# Patient Record
Sex: Female | Born: 1955 | Hispanic: No | Marital: Married | Smoking: Never smoker
Health system: Southern US, Community
[De-identification: ages and names within clinical notes are randomized; demographics above are authoritative.]

---

## 2001-01-26 ENCOUNTER — Encounter: Payer: Self-pay | Admitting: Emergency Medicine

## 2001-01-26 ENCOUNTER — Emergency Department (HOSPITAL_COMMUNITY): Admission: EM | Admit: 2001-01-26 | Discharge: 2001-01-26 | Payer: Self-pay | Admitting: Emergency Medicine

## 2005-07-18 ENCOUNTER — Other Ambulatory Visit: Payer: Self-pay

## 2005-07-18 ENCOUNTER — Emergency Department: Payer: Self-pay | Admitting: Emergency Medicine

## 2005-07-19 ENCOUNTER — Ambulatory Visit: Payer: Self-pay | Admitting: Emergency Medicine

## 2013-02-27 ENCOUNTER — Other Ambulatory Visit (HOSPITAL_COMMUNITY)
Admission: RE | Admit: 2013-02-27 | Discharge: 2013-02-27 | Disposition: A | Discharge status: Home / Self Care | Payer: BC Managed Care – PPO | Source: Ambulatory Visit | Attending: Internal Medicine | Admitting: Internal Medicine

## 2013-02-27 DIAGNOSIS — Z01419 Encounter for gynecological examination (general) (routine) without abnormal findings: Secondary | ICD-10-CM | POA: Insufficient documentation

## 2013-03-02 ENCOUNTER — Other Ambulatory Visit: Payer: Self-pay

## 2013-03-02 DIAGNOSIS — Z1231 Encounter for screening mammogram for malignant neoplasm of breast: Secondary | ICD-10-CM

## 2013-03-17 ENCOUNTER — Ambulatory Visit
Admission: RE | Admit: 2013-03-17 | Discharge: 2013-03-17 | Disposition: A | Discharge status: Home / Self Care | Payer: BC Managed Care – PPO | Source: Ambulatory Visit

## 2013-03-17 DIAGNOSIS — Z1231 Encounter for screening mammogram for malignant neoplasm of breast: Secondary | ICD-10-CM

## 2013-06-04 HISTORY — PX: EYE SURGERY: SHX253

## 2013-07-09 ENCOUNTER — Other Ambulatory Visit: Payer: Self-pay | Admitting: Internal Medicine

## 2013-07-09 DIAGNOSIS — E010 Iodine-deficiency related diffuse (endemic) goiter: Secondary | ICD-10-CM

## 2013-07-14 ENCOUNTER — Ambulatory Visit
Admission: RE | Admit: 2013-07-14 | Discharge: 2013-07-14 | Disposition: A | Discharge status: Home / Self Care | Payer: BC Managed Care – PPO | Source: Ambulatory Visit | Attending: Internal Medicine | Admitting: Internal Medicine

## 2013-07-14 DIAGNOSIS — E010 Iodine-deficiency related diffuse (endemic) goiter: Secondary | ICD-10-CM

## 2013-07-20 ENCOUNTER — Other Ambulatory Visit: Payer: Self-pay | Admitting: Internal Medicine

## 2013-07-20 DIAGNOSIS — Z1211 Encounter for screening for malignant neoplasm of colon: Secondary | ICD-10-CM

## 2014-06-13 IMAGING — MG MM DIGITAL SCREENING BILAT
4 series · 4 of 4 positions shown · non-contrast
Comparison: None.

CLINICAL DATA: Screening.

EXAM:
DIGITAL SCREENING BILATERAL MAMMOGRAM WITH CAD

[R MLO]
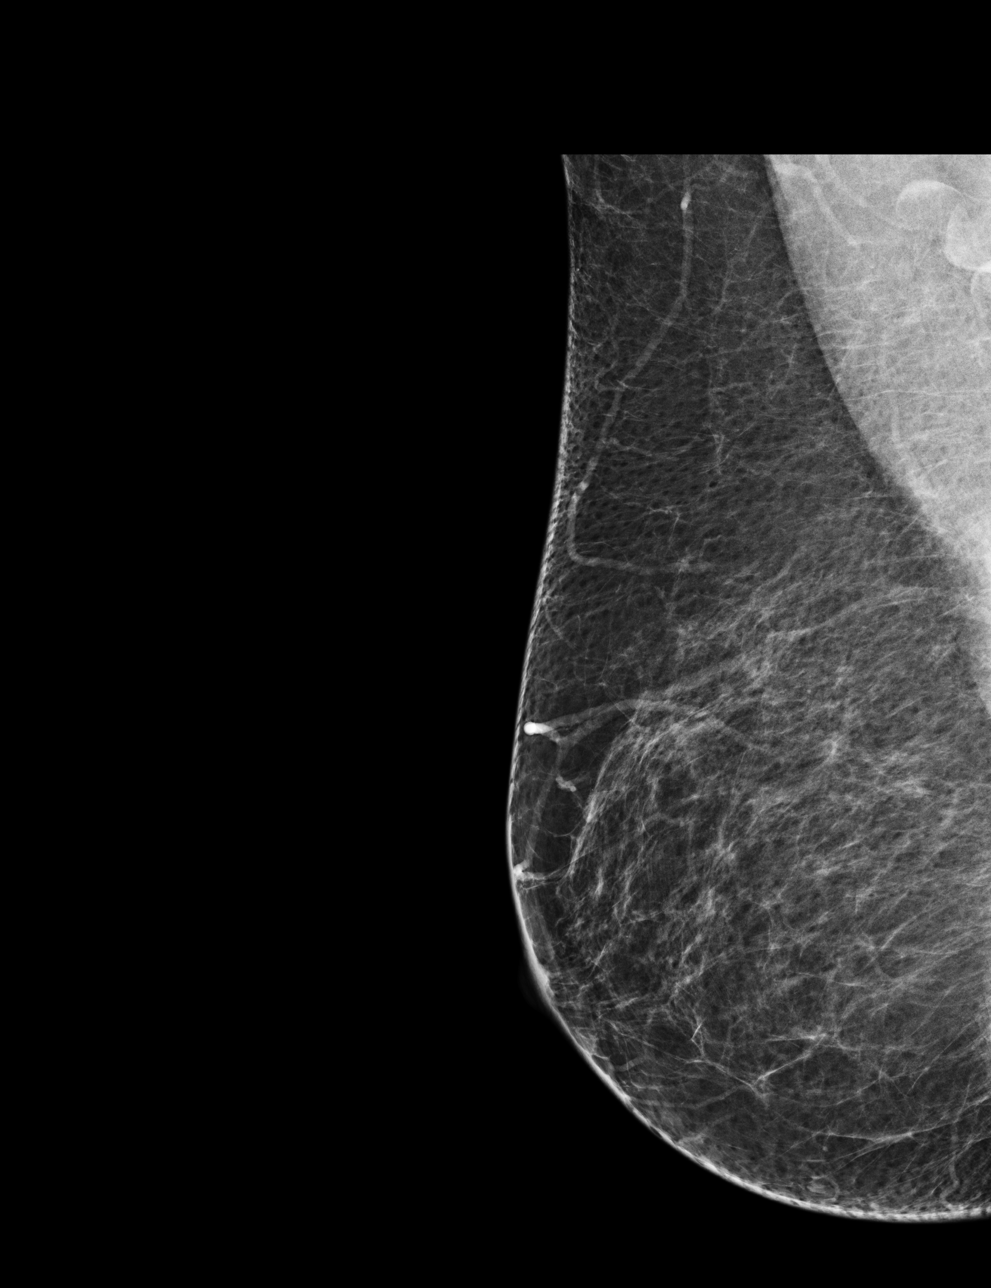

[R CC]
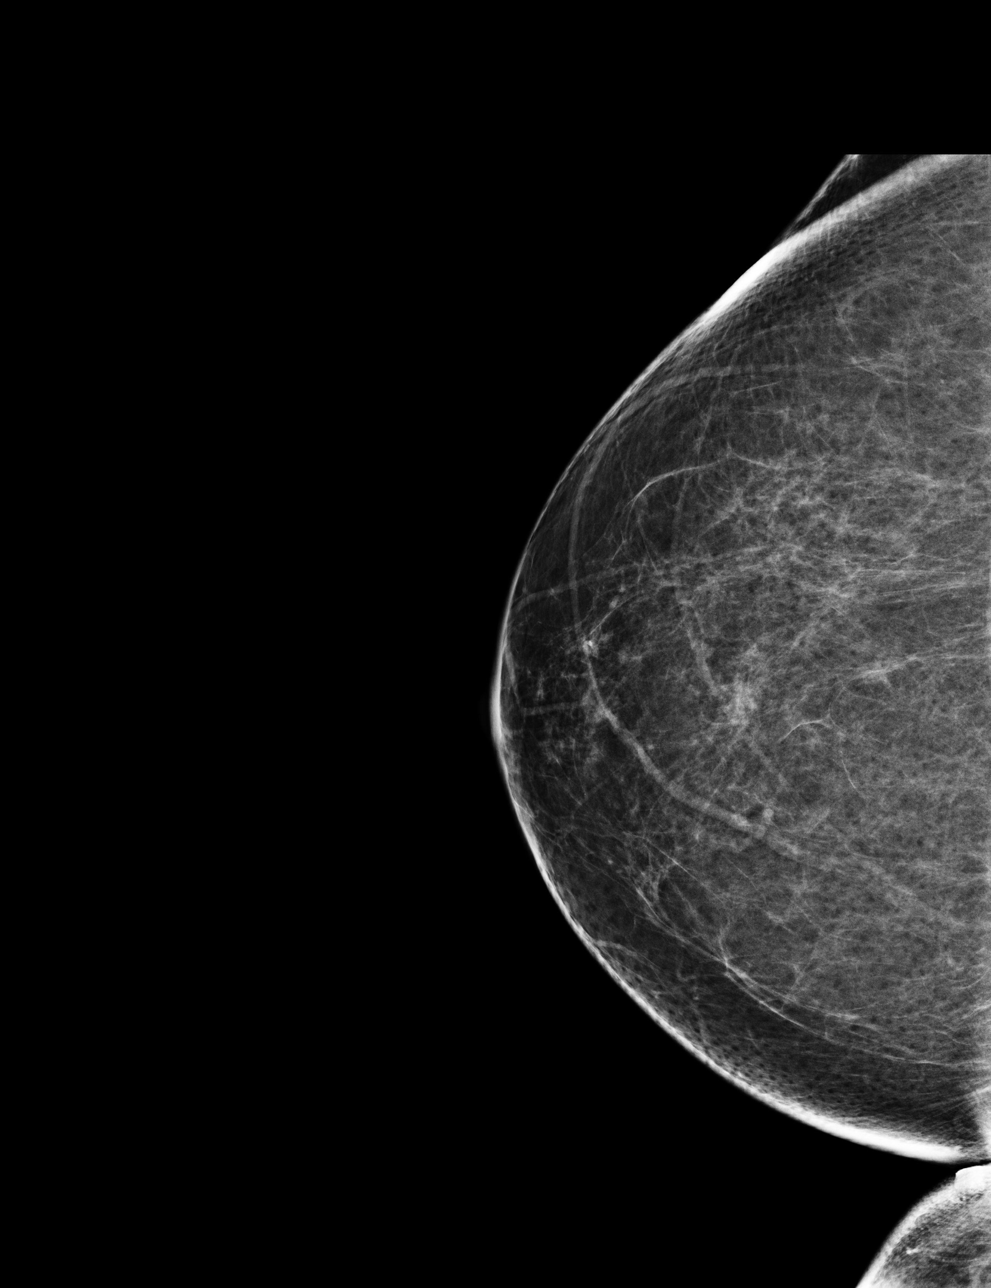

[L MLO]
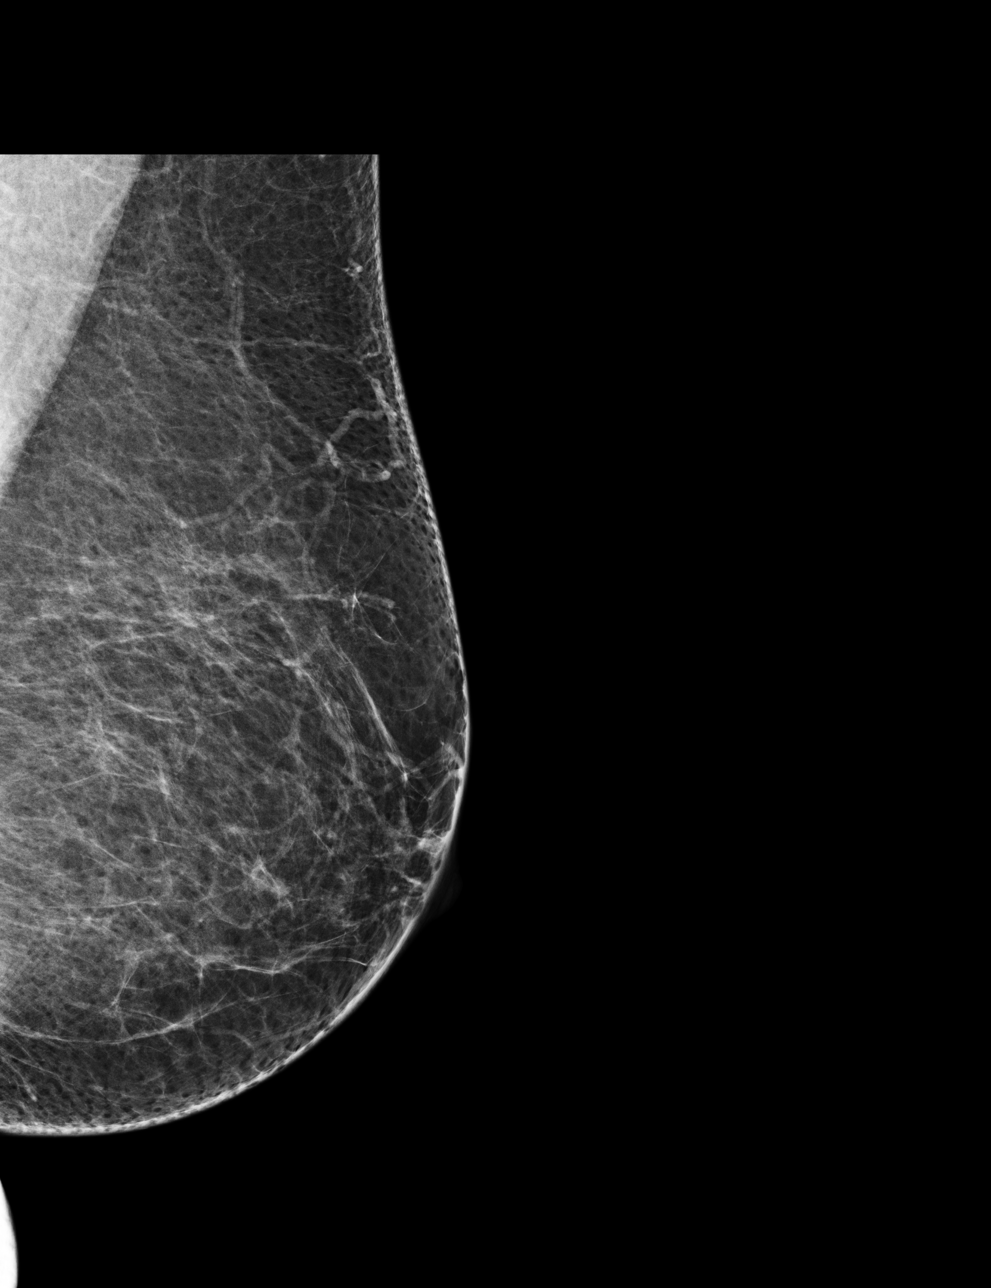

[L CC]
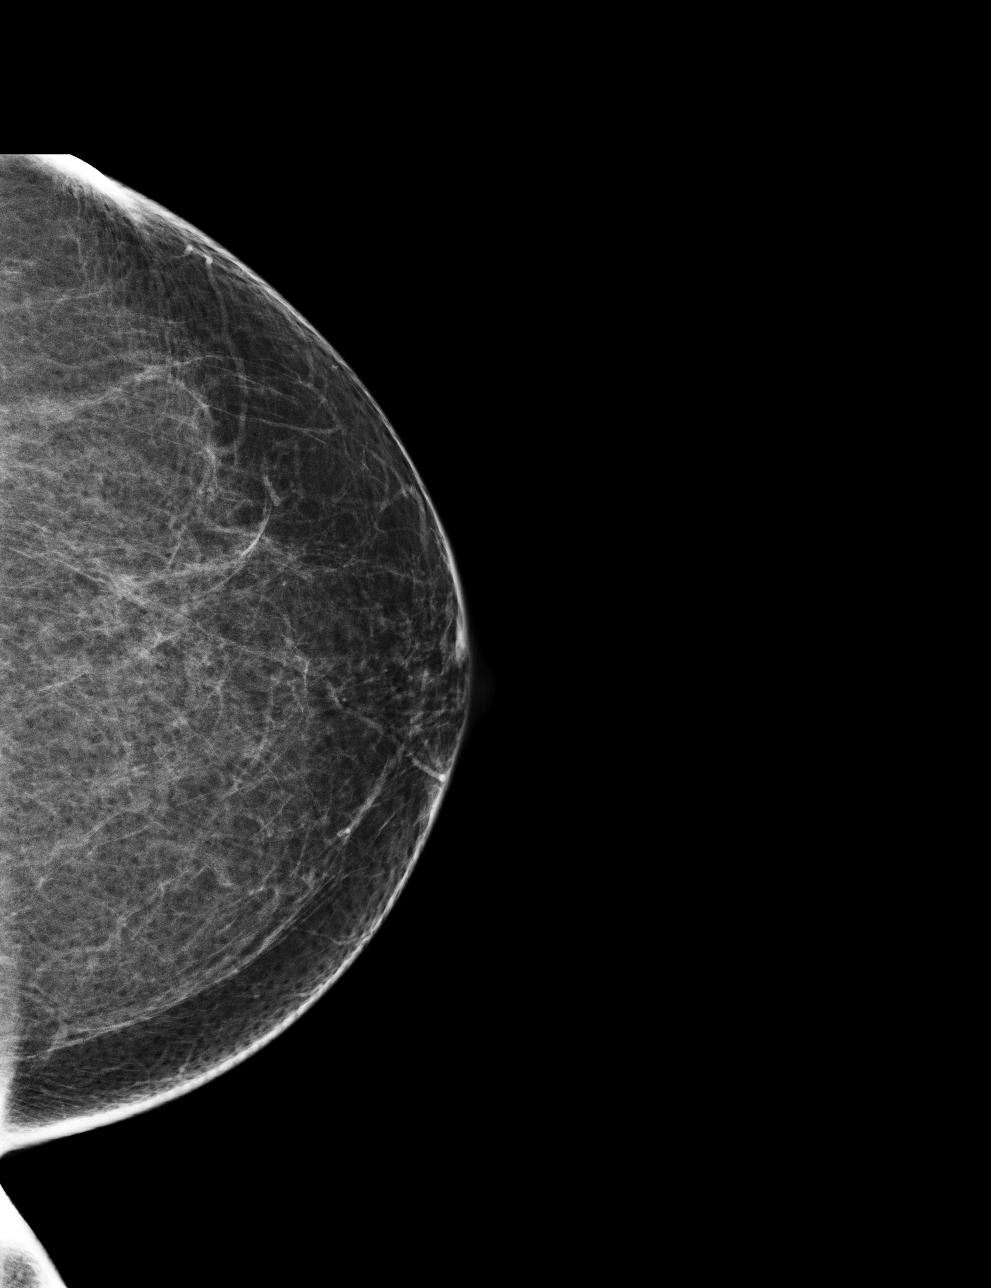

[4 of 4 positions shown; findings below may reference images not displayed]

ACR Breast Density Category b: There are scattered areas of
fibroglandular density.
FINDINGS: There are no findings suspicious for malignancy. Images were
processed with CAD.
IMPRESSION: No mammographic evidence of malignancy. A result letter of this
screening mammogram will be mailed directly to the patient.

RECOMMENDATION:
Screening mammogram in one year. (Code:WX-X-66M)

BI-RADS CATEGORY  1: Negative

## 2014-10-10 IMAGING — US US SOFT TISSUE HEAD/NECK
1 series · 14 of 25 positions shown · non-contrast
Comparison: None.

CLINICAL DATA: Thyromegaly on physical examination.

EXAM:
THYROID ULTRASOUND
TECHNIQUE: Ultrasound examination of the thyroid gland and adjacent soft
tissues was performed.

[Series 1: us soft tissue head/neck · 0.08mm/px · 14 of 38 slices shown]
[im 1/38]
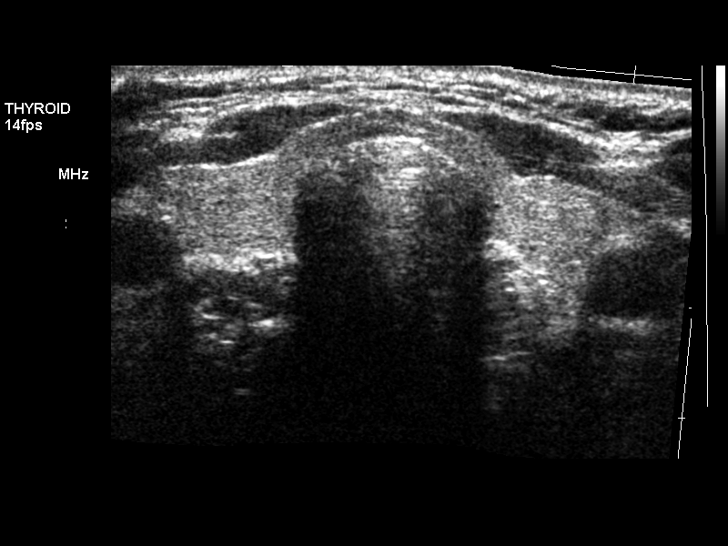
[im 4/38]
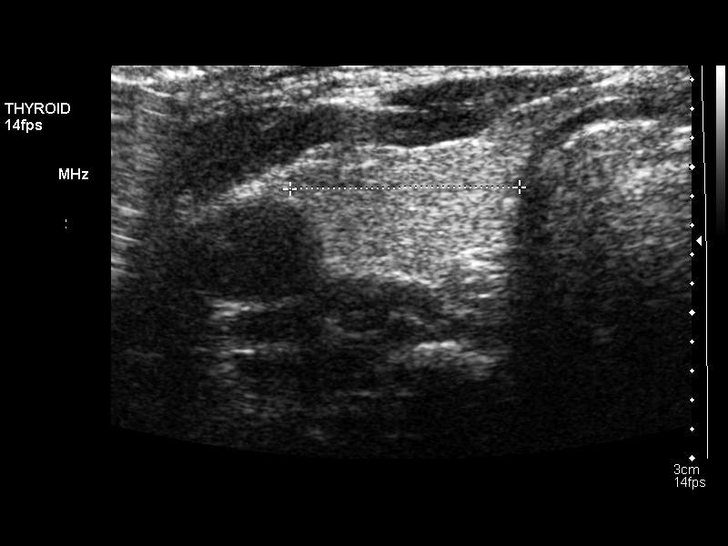
[im 7/38]
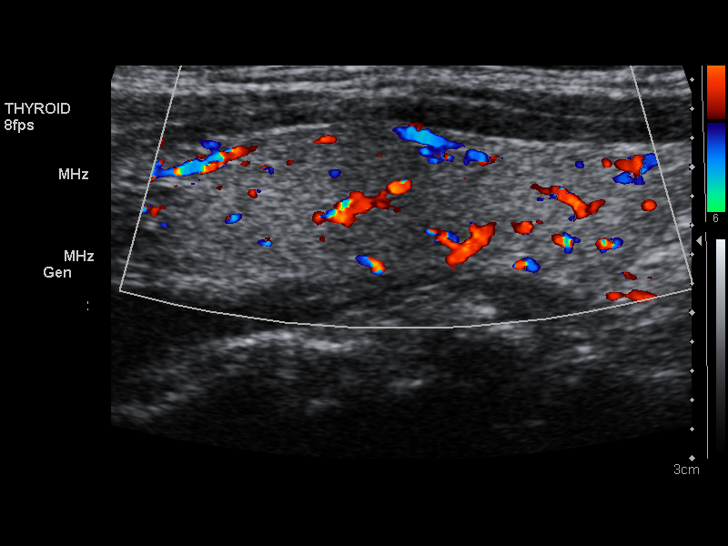
[im 10/38]
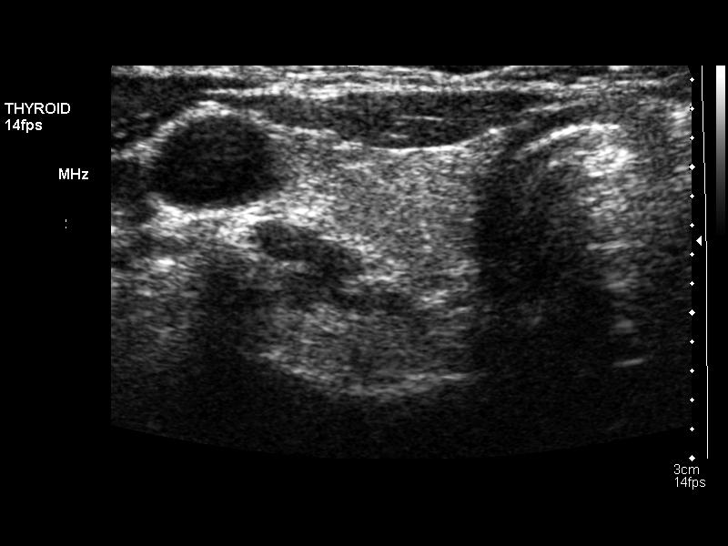
[im 13/38]
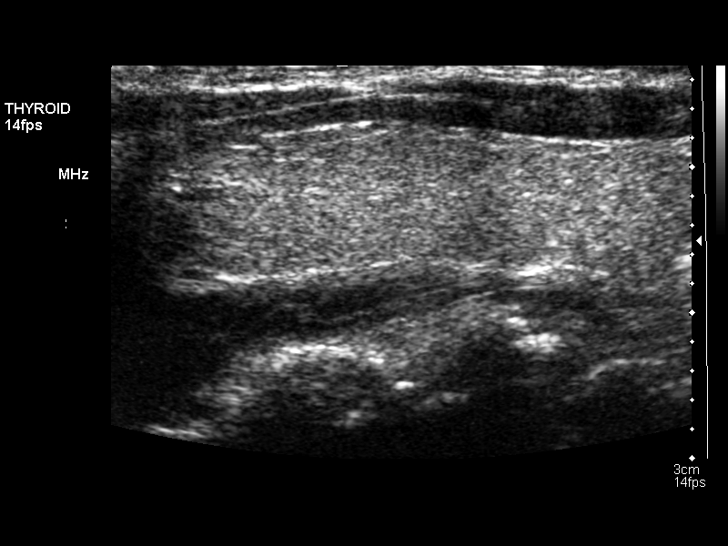
[im 14/38]
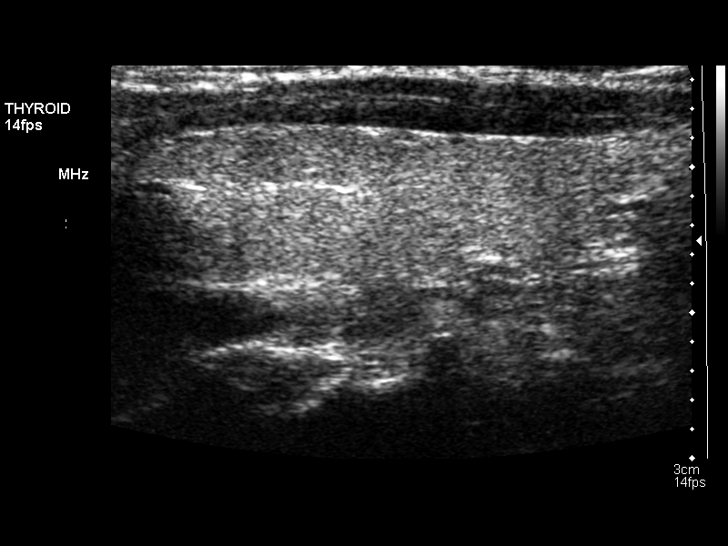
[im 17/38]
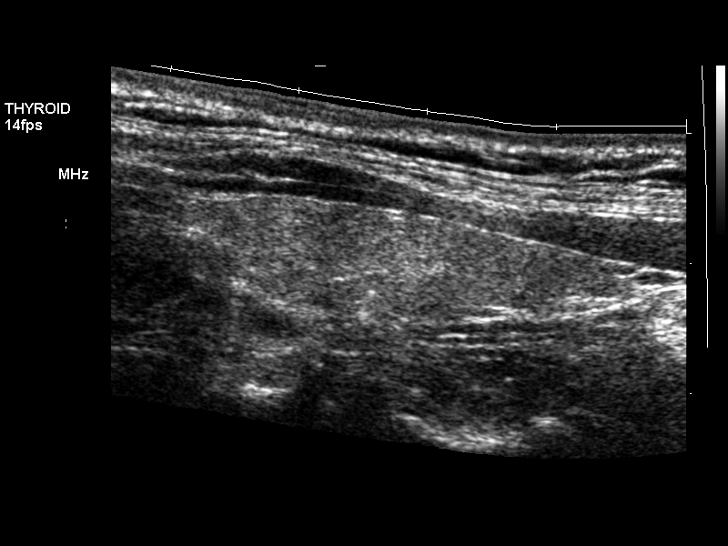
[im 21/38]
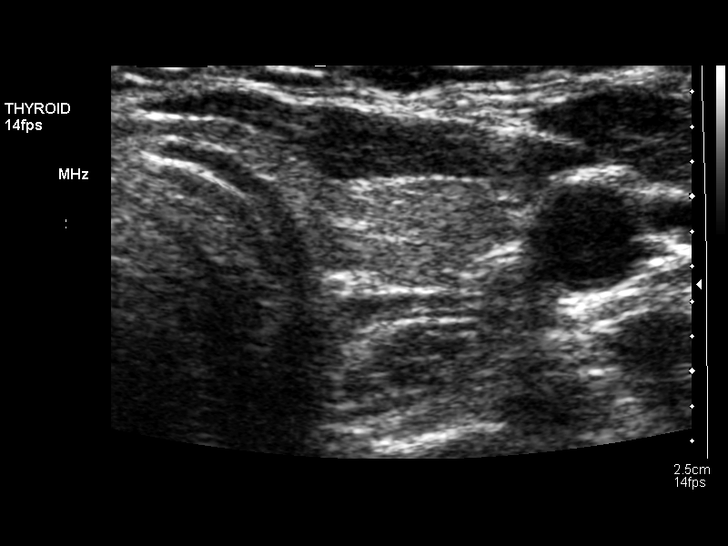
[im 24/38]
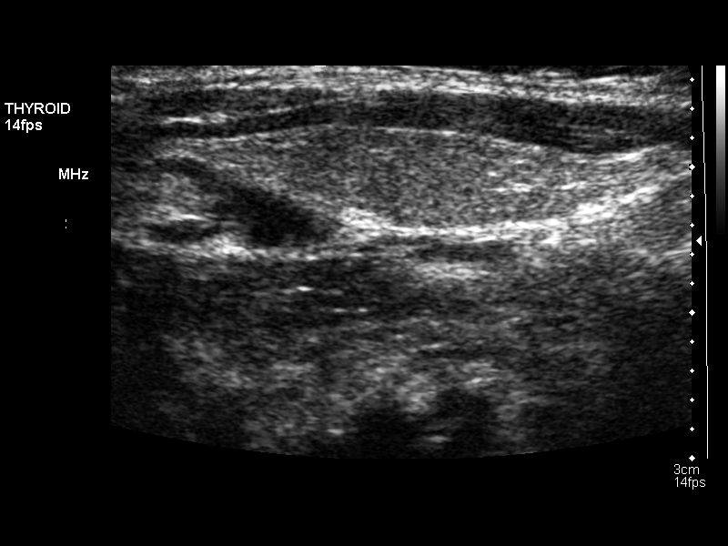
[im 25/38]
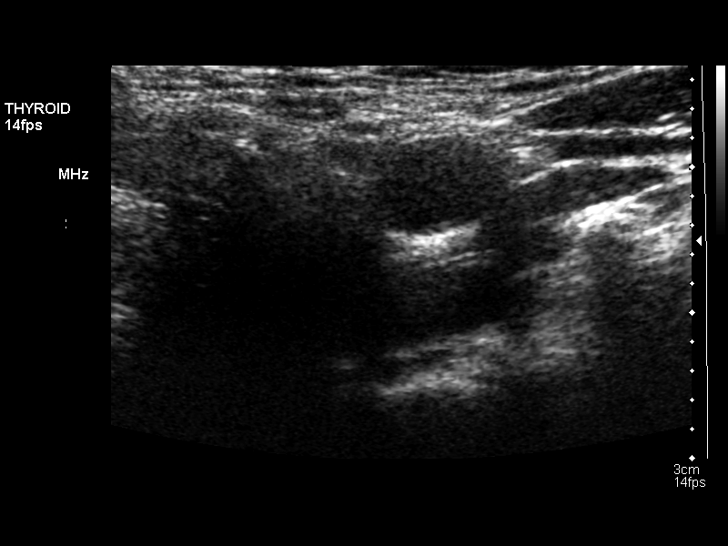
[im 28/38]
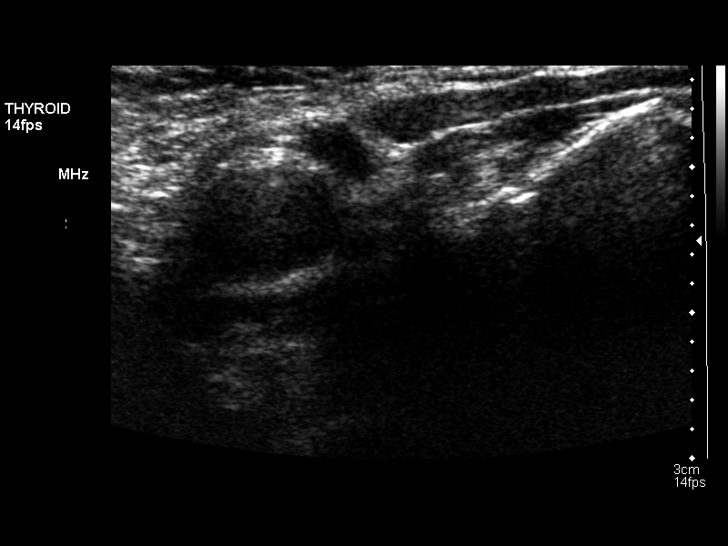
[im 31/38]
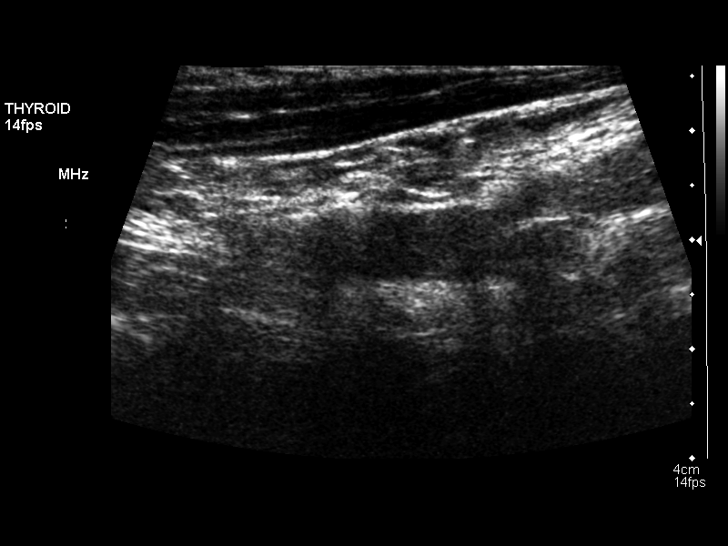
[im 34/38]
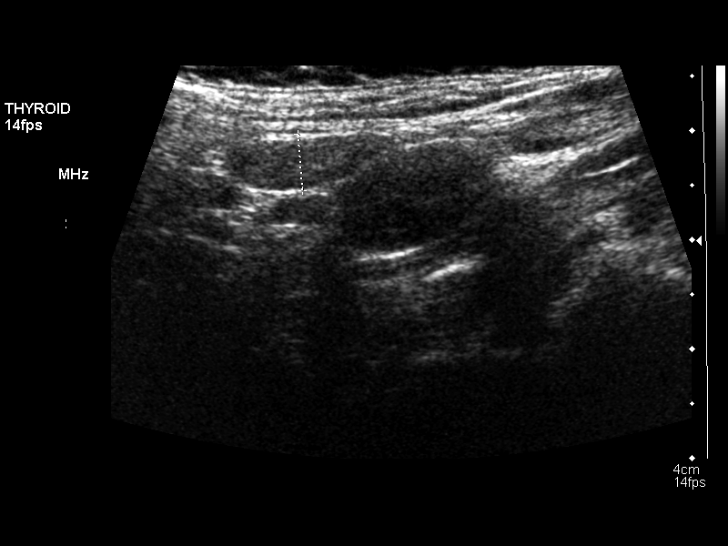
[im 38/38]
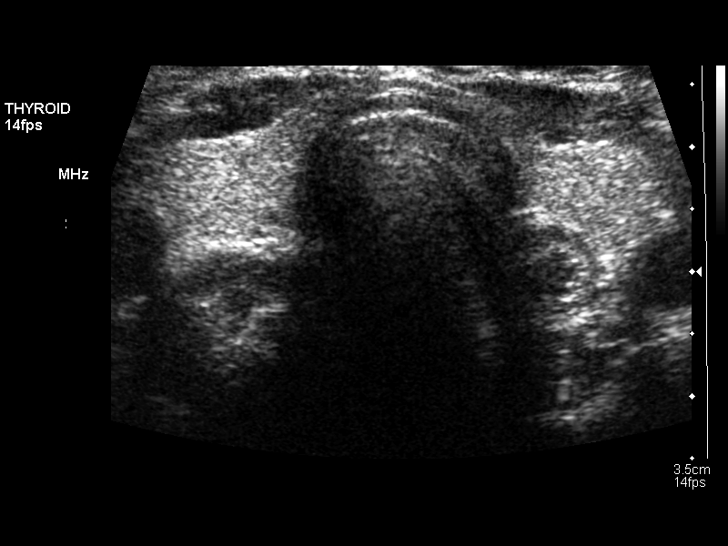

[14 of 25 positions shown; findings below may reference images not displayed]

FINDINGS: Right thyroid lobe

Measurements: 4.1 x 1.2 x 1.6 cm.  No nodules visualized.

Left thyroid lobe

Measurements: 3.7 x 1.0 x 1.6 cm.  No nodules visualized.

Isthmus

Thickness: 0.2 cm.  No nodules visualized.

The thyroid gland is normal in size and volume. Echotexture is
homogeneous. Vascularity appears subjectively normal.

Lymphadenopathy

None visualized.
IMPRESSION: Normal thyroid ultrasound.

## 2016-03-03 ENCOUNTER — Ambulatory Visit (HOSPITAL_COMMUNITY)
Admission: EM | Admit: 2016-03-03 | Discharge: 2016-03-03 | Disposition: A | Discharge status: Home / Self Care | Payer: BLUE CROSS/BLUE SHIELD | Attending: Family Medicine | Admitting: Family Medicine

## 2016-03-03 ENCOUNTER — Encounter (HOSPITAL_BASED_OUTPATIENT_CLINIC_OR_DEPARTMENT_OTHER): Payer: Self-pay | Admitting: Emergency Medicine

## 2016-03-03 ENCOUNTER — Encounter (HOSPITAL_COMMUNITY): Payer: Self-pay | Admitting: *Deleted

## 2016-03-03 ENCOUNTER — Emergency Department (HOSPITAL_BASED_OUTPATIENT_CLINIC_OR_DEPARTMENT_OTHER): Payer: BLUE CROSS/BLUE SHIELD

## 2016-03-03 ENCOUNTER — Emergency Department (HOSPITAL_BASED_OUTPATIENT_CLINIC_OR_DEPARTMENT_OTHER)
Admission: EM | Admit: 2016-03-03 | Discharge: 2016-03-04 | Disposition: A | Discharge status: Other Acute Inpatient Hospital | Payer: BLUE CROSS/BLUE SHIELD | Attending: Emergency Medicine | Admitting: Emergency Medicine

## 2016-03-03 DIAGNOSIS — R0902 Hypoxemia: Secondary | ICD-10-CM | POA: Diagnosis not present

## 2016-03-03 DIAGNOSIS — R21 Rash and other nonspecific skin eruption: Secondary | ICD-10-CM | POA: Insufficient documentation

## 2016-03-03 DIAGNOSIS — D72819 Decreased white blood cell count, unspecified: Secondary | ICD-10-CM | POA: Diagnosis not present

## 2016-03-03 DIAGNOSIS — R509 Fever, unspecified: Secondary | ICD-10-CM | POA: Diagnosis not present

## 2016-03-03 LAB — COMPREHENSIVE METABOLIC PANEL
ALBUMIN: 4.2 g/dL (ref 3.5–5.0)
ALK PHOS: 50 U/L (ref 38–126)
ALT: 21 U/L (ref 14–54)
ANION GAP: 10 (ref 5–15)
AST: 31 U/L (ref 15–41)
BUN: 12 mg/dL (ref 6–20)
CALCIUM: 8.5 mg/dL — AB (ref 8.9–10.3)
CO2: 24 mmol/L (ref 22–32)
Chloride: 98 mmol/L — ABNORMAL LOW (ref 101–111)
Creatinine, Ser: 0.76 mg/dL (ref 0.44–1.00)
GFR calc Af Amer: >60 mL/min (ref >60)
GFR calc non Af Amer: >60 mL/min (ref >60)
GLUCOSE: 112 mg/dL — AB (ref 65–99)
Potassium: 3.2 mmol/L — ABNORMAL LOW (ref 3.5–5.1)
Sodium: 132 mmol/L — ABNORMAL LOW (ref 135–145)
TOTAL PROTEIN: 7.1 g/dL (ref 6.5–8.1)
Total Bilirubin: 0.4 mg/dL (ref 0.3–1.2)

## 2016-03-03 LAB — I-STAT CG4 LACTIC ACID, ED: LACTIC ACID, VENOUS: 1.07 mmol/L (ref 0.5–1.9)

## 2016-03-03 LAB — URINALYSIS, ROUTINE W REFLEX MICROSCOPIC
Bilirubin Urine: NEGATIVE
GLUCOSE, UA: NEGATIVE mg/dL
KETONES UR: 15 mg/dL — AB
Leukocytes, UA: NEGATIVE
Nitrite: NEGATIVE
PH: 5.5 (ref 5.0–8.0)
PROTEIN: 30 mg/dL — AB
Specific Gravity, Urine: 1.011 (ref 1.005–1.030)

## 2016-03-03 LAB — CBC WITH DIFFERENTIAL/PLATELET
BASOS PCT: 0 %
Basophils Absolute: 0 10*3/uL (ref 0.0–0.1)
EOS ABS: 0 10*3/uL (ref 0.0–0.7)
EOS PCT: 0 %
HCT: 35.6 % — ABNORMAL LOW (ref 36.0–46.0)
Hemoglobin: 12.5 g/dL (ref 12.0–15.0)
Lymphocytes Relative: 13 %
Lymphs Abs: 0.5 10*3/uL — ABNORMAL LOW (ref 0.7–4.0)
MCH: 30 pg (ref 26.0–34.0)
MCHC: 35.1 g/dL (ref 30.0–36.0)
MCV: 85.6 fL (ref 78.0–100.0)
MONO ABS: 0.4 10*3/uL (ref 0.1–1.0)
MONOS PCT: 10 %
Neutro Abs: 3 10*3/uL (ref 1.7–7.7)
Neutrophils Relative %: 77 %
Platelets: 141 10*3/uL — ABNORMAL LOW (ref 150–400)
RBC: 4.16 MIL/uL (ref 3.87–5.11)
RDW: 12.4 % (ref 11.5–15.5)
WBC: 3.9 10*3/uL — ABNORMAL LOW (ref 4.0–10.5)

## 2016-03-03 LAB — URINE MICROSCOPIC-ADD ON

## 2016-03-03 LAB — LIPASE, BLOOD: Lipase: 38 U/L (ref 11–51)

## 2016-03-03 MED ORDER — ACETAMINOPHEN 500 MG PO TABS
1000.0000 mg | ORAL_TABLET | Freq: Once | ORAL | Status: AC
  Administered 2016-03-03: 1000 mg via ORAL

## 2016-03-03 MED ORDER — DIPHENHYDRAMINE HCL 50 MG/ML IJ SOLN
25.0000 mg | Freq: Once | INTRAMUSCULAR | Status: AC
  Administered 2016-03-03: 25 mg via INTRAVENOUS
  Filled 2016-03-03: qty 1

## 2016-03-03 MED ORDER — ACETAMINOPHEN 500 MG PO TABS
1000.0000 mg | ORAL_TABLET | Freq: Once | ORAL | Status: DC
  Filled 2016-03-03: qty 2

## 2016-03-03 MED ORDER — SODIUM CHLORIDE 0.9 % IV BOLUS (SEPSIS)
1000.0000 mL | Freq: Once | INTRAVENOUS | Status: AC
Start: 2016-03-03 — End: 2016-03-04
  Administered 2016-03-03: 1000 mL via INTRAVENOUS

## 2016-03-03 NOTE — ED Triage Notes (Signed)
Pt  Reports   Symptoms  Of  Fever    Body  Aches   And  Rash   Her face  Is  Swollen       She    Reports  Recent    Pt  Reports  Symptoms   After arriving via   Airplane    From  [place]    Pt  Is   Sitting  Upright  On the  Exam table     She  Took  Some    otc    Cold  Medicine  As  Well  As  Tylenol  Yesterday

## 2016-03-03 NOTE — ED Notes (Signed)
Patient transported to X-ray 

## 2016-03-03 NOTE — ED Provider Notes (Signed)
[facility]    CSN: [id number] Arrival date & time: 03/03/16  1916     History   Chief Complaint Chief Complaint  Patient presents with  . Rash    HPI Chelsea Evans is a 60 y.o. female.   The history is provided by the patient and a relative.  Rash  Location:  Full body Quality: redness and swelling   Severity:  Moderate Onset quality:  Sudden Duration:  2 days Progression:  Worsening Chronicity:  Recurrent Context comment:  Onset th[place] from [place] assoc fever , chills. Associated symptoms: fever and nausea     History reviewed. No pertinent past medical history.  There are no active problems to display for this patient.   History reviewed. No pertinent surgical history.  OB History    No data available       Home Medications    Prior to Admission medications   Medication Sig Start Date End Date Taking? Authorizing Provider  Homeopathic Products [facility](ZICAM COLD REMEDY PO) Take by mouth.   Yes Historical Provider, MD  Ibuprofen (ADVIL PO) Take by mouth.   Yes Historical Provider, MD    Family History No family history on file.  Social History Social History  Substance Use Topics  . Smoking status: Not on file  . Smokeless tobacco: Not on file  . Alcohol use Not on file     Allergies   Aspirin and Nyquil multi-symptom [pseudoeph-doxylamine-dm-apap]   Review of Systems Review of Systems  Constitutional: Positive for chills and fever.  HENT: Negative.   Respiratory: Negative.   Gastrointestinal: Positive for nausea.  Genitourinary: Negative.   Skin: Positive for rash.  All other systems reviewed and are negative.    Physical Exam Triage Vital Signs ED Triage Vitals [03/03/16 2006]  Enc Vitals Group     BP 144/83     Pulse Rate 110     Resp 18     Temp (!) 103.3 F (39.6 C)     Temp Source Oral     SpO2 97 %     Weight      Height      Head Circumference      Peak Flow      Pain Score    Pain Loc      Pain Edu?      Excl. in GC?    No data found.   Updated Vital Signs BP 144/83 (BP Location: Right Arm)   Pulse 110   Temp (!) 103.3 F (39.6 C) (Oral)   Resp 18   SpO2 97%   Visual Acuity Right Eye Distance:   Left Eye Distance:   Bilateral Distance:    Right Eye Near:   Left Eye Near:    Bilateral Near:     Physical Exam  Constitutional: She is oriented to person, place, and time. She appears well-developed and well-nourished. No distress.  HENT:  Mouth/Throat: Oropharynx is clear and moist.  Neck: Normal range of motion. Neck supple.  Cardiovascular: Normal rate, regular rhythm, normal heart sounds and intact distal pulses.   Pulmonary/Chest: Effort normal and breath sounds normal.  Abdominal: Soft. Bowel sounds are normal. There is no tenderness.  Lymphadenopathy:    She has no cervical adenopathy.  Neurological: She is alert and oriented to person, place, and time.  Skin: Rash noted. There is erythema.  Nursing note and vitals reviewed.    UC Treatments / Results  Labs (all labs ordered are  listed, but only abnormal results are displayed) Labs Reviewed - No data to display  EKG  EKG Interpretation None       Radiology No results found.  Procedures Procedures (including critical care time)  Medications Ordered in UC Medications - No data to display   Initial Impression / Assessment and Plan / UC Course  I have reviewed the triage vital signs and the nursing notes.  Pertinent labs & imaging results that were available during my care of the patient were reviewed by me and considered in my medical decision making (see chart for details).  Clinical Course      Final Clinical Impressions(s) / UC Diagnoses   Final diagnoses:  None    New Prescriptions New Prescriptions   No medications on file     Linna Hoff, MD 03/03/16 2040

## 2016-03-03 NOTE — ED Notes (Signed)
Pt 89% on R/A. Pt placed on N/C at 2L. No dyspnea noted, pt speaking in complete sentences between breaths. Dr. Clarene DukeLittle at bedside.

## 2016-03-03 NOTE — ED Notes (Signed)
Per pt clarified that she thinks she is having an allergic reaction with swelling, reddness, and hives to face, hands, and torso. Symptoms started after taking ibuprofen, and APAP for fever and was noticiable yesterday evening (9/29). Pt denies tongue swelling and ShOB. Lung sounds clear & =.   Pt states that on Thursday (9/28) she started running a fever, with nausea and decreased appetite .

## 2016-03-03 NOTE — ED Provider Notes (Signed)
[facility]   CSN: [id number] Arrival date & time: 03/03/16  2116   By signing my name below, I, [name], attest that this documentation has been prepared under the direction and in the presence of Laurence Spatesachel Morgan Little, MD . Electronically Signed: [name], Scribe. 03/03/2016. 10:01 PM.  History   Chief Complaint Chief Complaint  Patient presents with  . Rash  . Fever  . Headache   The history is provided by the patient and a relative. The history is limited by a language barrier. No language interpreter was used.    HPI Comments:  Chelsea Evans is a 60 y.o. female who presents to the Emergency Department complaining of diffuse rash beginning last night. When asked about associated symptoms, her family states that the symptoms began 2 days ago as general body aches and has worsened to also include headaches, fever and chills. Pt denies any SOB, CP, cough, vomiting or diarrhea. Pt returned 2 days ago from a trip to [place], where she stayed for one month. She took a dose of tylenol when her symptoms began, then started taking 1 Advil and 1 Zycam every 4 hours. The pt's family reports that last night the pt took 2 advil at once, and that was around the time when her rash symptoms began. They also report that this type of rash occurs sometimes when the pt takes any OTC medications. Pt is a nonsmoker and does not drink alcohol. Her husband was traveling with her and does not have any symptoms.  History reviewed. No pertinent past medical history.  There are no active problems to display for this patient.   Past Surgical History:  Procedure Laterality Date  . EYE SURGERY  2015    OB History    No data available       Home Medications    Prior to Admission medications   Medication Sig Start Date End Date Taking? Authorizing Provider  Homeopathic Products [facility](ZICAM COLD REMEDY PO) Take by mouth.    Historical Provider, MD  Ibuprofen (ADVIL PO)  Take by mouth.    Historical Provider, MD    Family History No family history on file.  Social History Social History  Substance Use Topics  . Smoking status: Never Smoker  . Smokeless tobacco: Never Used  . Alcohol use No     Allergies   Aspirin and Nyquil multi-symptom [pseudoeph-doxylamine-dm-apap]   Review of Systems Review of Systems  10 Systems reviewed and are negative for acute change except as noted in the HPI.    Physical Exam Updated Vital Signs BP 112/70   Pulse 92   Temp 101.9 F (38.8 C) (Oral)   Resp 25   SpO2 97%   Physical Exam  Constitutional: She is oriented to person, place, and time. She appears well-developed and well-nourished. No distress.  HENT:  Head: Normocephalic and atraumatic.  Mouth/Throat: Oropharynx is clear and moist.  Moist mucous membranes  Eyes: Conjunctivae are normal. Pupils are equal, round, and reactive to light.  Neck: Neck supple.  Cardiovascular: Regular rhythm and normal heart sounds.  Tachycardia present.   No murmur heard. Pulmonary/Chest: Effort normal and breath sounds normal.  Abdominal: Soft. Bowel sounds are normal. She exhibits no distension. There is no tenderness.  Musculoskeletal: She exhibits no edema.  Neurological: She is alert and oriented to person, place, and time.  Fluent speech  Skin: Skin is warm and dry. Capillary refill takes less than 2 seconds. Rash noted.  Malar  rash across cheeks and nose; uritcarial rash on back  Psychiatric: She has a normal mood and affect. Judgment normal.  Nursing note and vitals reviewed.    ED Treatments / Results  DIAGNOSTIC STUDIES:  Oxygen Saturation is 94% on RA, low by my interpretation.    COORDINATION OF CARE:  10:31 PM Discussed treatment plan with pt at bedside which included labwork and imaging and pt agreed to plan.  Labs (all labs ordered are listed, but only abnormal results are displayed) Labs Reviewed  COMPREHENSIVE METABOLIC PANEL -  Abnormal; Notable for the following:       Result Value   Sodium 132 (*)    Potassium 3.2 (*)    Chloride 98 (*)    Glucose, Bld 112 (*)    Calcium 8.5 (*)    All other components within normal limits  CBC WITH DIFFERENTIAL/PLATELET - Abnormal; Notable for the following:    WBC 3.9 (*)    HCT 35.6 (*)    Platelets 141 (*)    Lymphs Abs 0.5 (*)    All other components within normal limits  URINALYSIS, ROUTINE W REFLEX MICROSCOPIC (NOT AT [facility]) - Abnormal; Notable for the following:    Hgb urine dipstick MODERATE (*)    Ketones, ur 15 (*)    Protein, ur 30 (*)    All other components within normal limits  URINE MICROSCOPIC-ADD ON - Abnormal; Notable for the following:    Squamous Epithelial / LPF 0-5 (*)    Bacteria, UA FEW (*)    Casts GRANULAR CAST (*)    All other components within normal limits  URINE CULTURE  CULTURE, BLOOD (ROUTINE X 2)  CULTURE, BLOOD (ROUTINE X 2)  LIPASE, BLOOD  ZIKA VIRUS NAA COMPREHENSIVE  PARASITE EXAM SCREEN, BLOOD-W CONF TO [facility] (NOT @ [facility])  I-STAT CG4 LACTIC ACID, ED    EKG  EKG Interpretation None       Radiology Dg Chest 2 View  Result Date: 03/03/2016 CLINICAL DATA:  Acute onset of fever and diffuse rash. Initial encounter. EXAM: CHEST  2 VIEW COMPARISON:  Chest radiograph performed 07/18/2005 FINDINGS: The lungs are well-aerated and clear. There is no evidence of focal opacification, pleural effusion or pneumothorax. The heart is normal in size; the mediastinal contour is within normal limits. No acute osseous abnormalities are seen. IMPRESSION: No acute cardiopulmonary process seen. Electronically Signed   By: Roanna Raider M.D.   On: 03/03/2016 23:06    Procedures Procedures (including critical care time)  Medications Ordered in ED Medications  sodium chloride 0.9 % bolus 1,000 mL (1,000 mLs Intravenous New Bag/Given 03/03/16 2313)  diphenhydrAMINE (BENADRYL) injection 25 mg (25 mg Intravenous Given 03/03/16 2313)    acetaminophen (TYLENOL) tablet 1,000 mg (1,000 mg Oral Given 03/03/16 2313)     Initial Impression / Assessment and Plan / ED Course  I have reviewed the triage vital signs and the nursing notes.  Pertinent labs & imaging results that were available during my care of the patient were reviewed by me and considered in my medical decision making (see chart for details).  Clinical Course   Patient recently returned home after a [place] presents with 2 days of generalized malaise, fever, headaches, myalgias, and rash that began last night. She was sent here from urgent care. On arrival, she was awake and alert, nontoxic and in no acute distress. She was febrile at 101.9, mildly tachycardic, blood pressure reassuring. O2 saturation 89% on room air,  placed on 2 L nasal cannula. The patient denied any respiratory symptoms. She had a malar rash on her face and urticarial-like rash on back, no petechiae or purpura. Neck supple. Obtained above labs including lactate, blood and urine cultures. Differential is very broad because of her recent travel and includes tropical viruses such as Zika, Chikungunya, Yellow Fever, Typhoid, or bacterial process such as pneumonia, or parasitic infection such as malaria. Send malaria smear and Zika studies. Gave IVF bolus and tylenol.  CXR negative, lactate normal. Labs show sodium 132, WBC 3.9, platelets 141K. this pattern is suspicious for viral process, with normal lactate I doubt sepsis. The patient continues to have an oxygen requirement, at 97% on 2 L nasal cannula although she denies any respiratory symptoms. I have discussed admission with the family due to her oxygen requirement and unclear etiology of her symptoms. They have requested [facility]. I discussed with hospitalist, Dr. Pilar Grammes, who has accepted pt in transfer. Pt will be transferred for further care.  Final Clinical Impressions(s) / ED Diagnoses   Final diagnoses:  None     New Prescriptions New Prescriptions   No medications on file  I personally performed the services described in this documentation, which was scribed in my presence. The recorded information has been reviewed and is accurate.     Laurence Spates, MD 03/04/16 [phone]

## 2016-03-03 NOTE — ED Notes (Signed)
Pt  Was  Advised    To  Be transferred  To  [facility]  She  Refused   She  Stated  She  Would  [facility]  [facility]  She left  With  Family  And  She  Was  Masked

## 2016-03-03 NOTE — Discharge Instructions (Signed)
Go to med center for further check tonight

## 2016-03-03 NOTE — ED Notes (Signed)
Per pt she has been able to take APAP before without having any reaction.

## 2016-03-03 NOTE — ED Triage Notes (Addendum)
Per family and patient, pt has had fever for past few days, headache and facial rash that started last night and generalized body aches.  Reddened butterfly rash across cheeks and nose.  Pt has had similar facial flushing reaction to aspirin and nyquil in past and stopped taking ibuprofen at home thinking this may be another reaction to medication.  Pt had been taking 1 ibuprofen every 4 hours prior to 2100 last night for past 2 days for fever control.

## 2016-03-03 NOTE — ED Notes (Signed)
Pt states she generally does not take tylenol because it can upset her stomach sometimes, but that she has never had an allergic reaction to it.  Pt last took tylenol at home 2 days ago.

## 2016-03-04 LAB — PARASITE EXAM SCREEN, BLOOD-W CONF TO LABCORP (NOT @ ARMC)

## 2016-03-04 NOTE — ED Notes (Signed)
MD at bedside. 

## 2016-03-04 NOTE — ED Notes (Signed)
Pt has had mild improvement of rash and swelling after diphenhydramine administration, however still present.

## 2016-03-04 NOTE — ED Notes (Signed)
Pt placed on droplet precautions per MD discretion.

## 2016-03-05 LAB — URINE CULTURE: SPECIAL REQUESTS: NORMAL

## 2016-03-06 LAB — ZIKA VIRUS NAA COMPREHENSIVE
Zika Virus, NAA, Serum: NEGATIVE
Zika Virus, NAA, Urine: NEGATIVE

## 2016-03-06 LAB — PARASITE EXAM, BLOOD

## 2016-03-09 LAB — CULTURE, BLOOD (ROUTINE X 2)
Culture: NO GROWTH
Culture: NO GROWTH

## 2017-05-30 IMAGING — CR DG CHEST 2V
2 series · 2 of 2 positions shown · non-contrast
Comparison: Chest radiograph performed 07/18/2005

CLINICAL DATA: Acute onset of fever and diffuse rash. Initial
encounter.

EXAM:
CHEST  2 VIEW

[w chest pa]
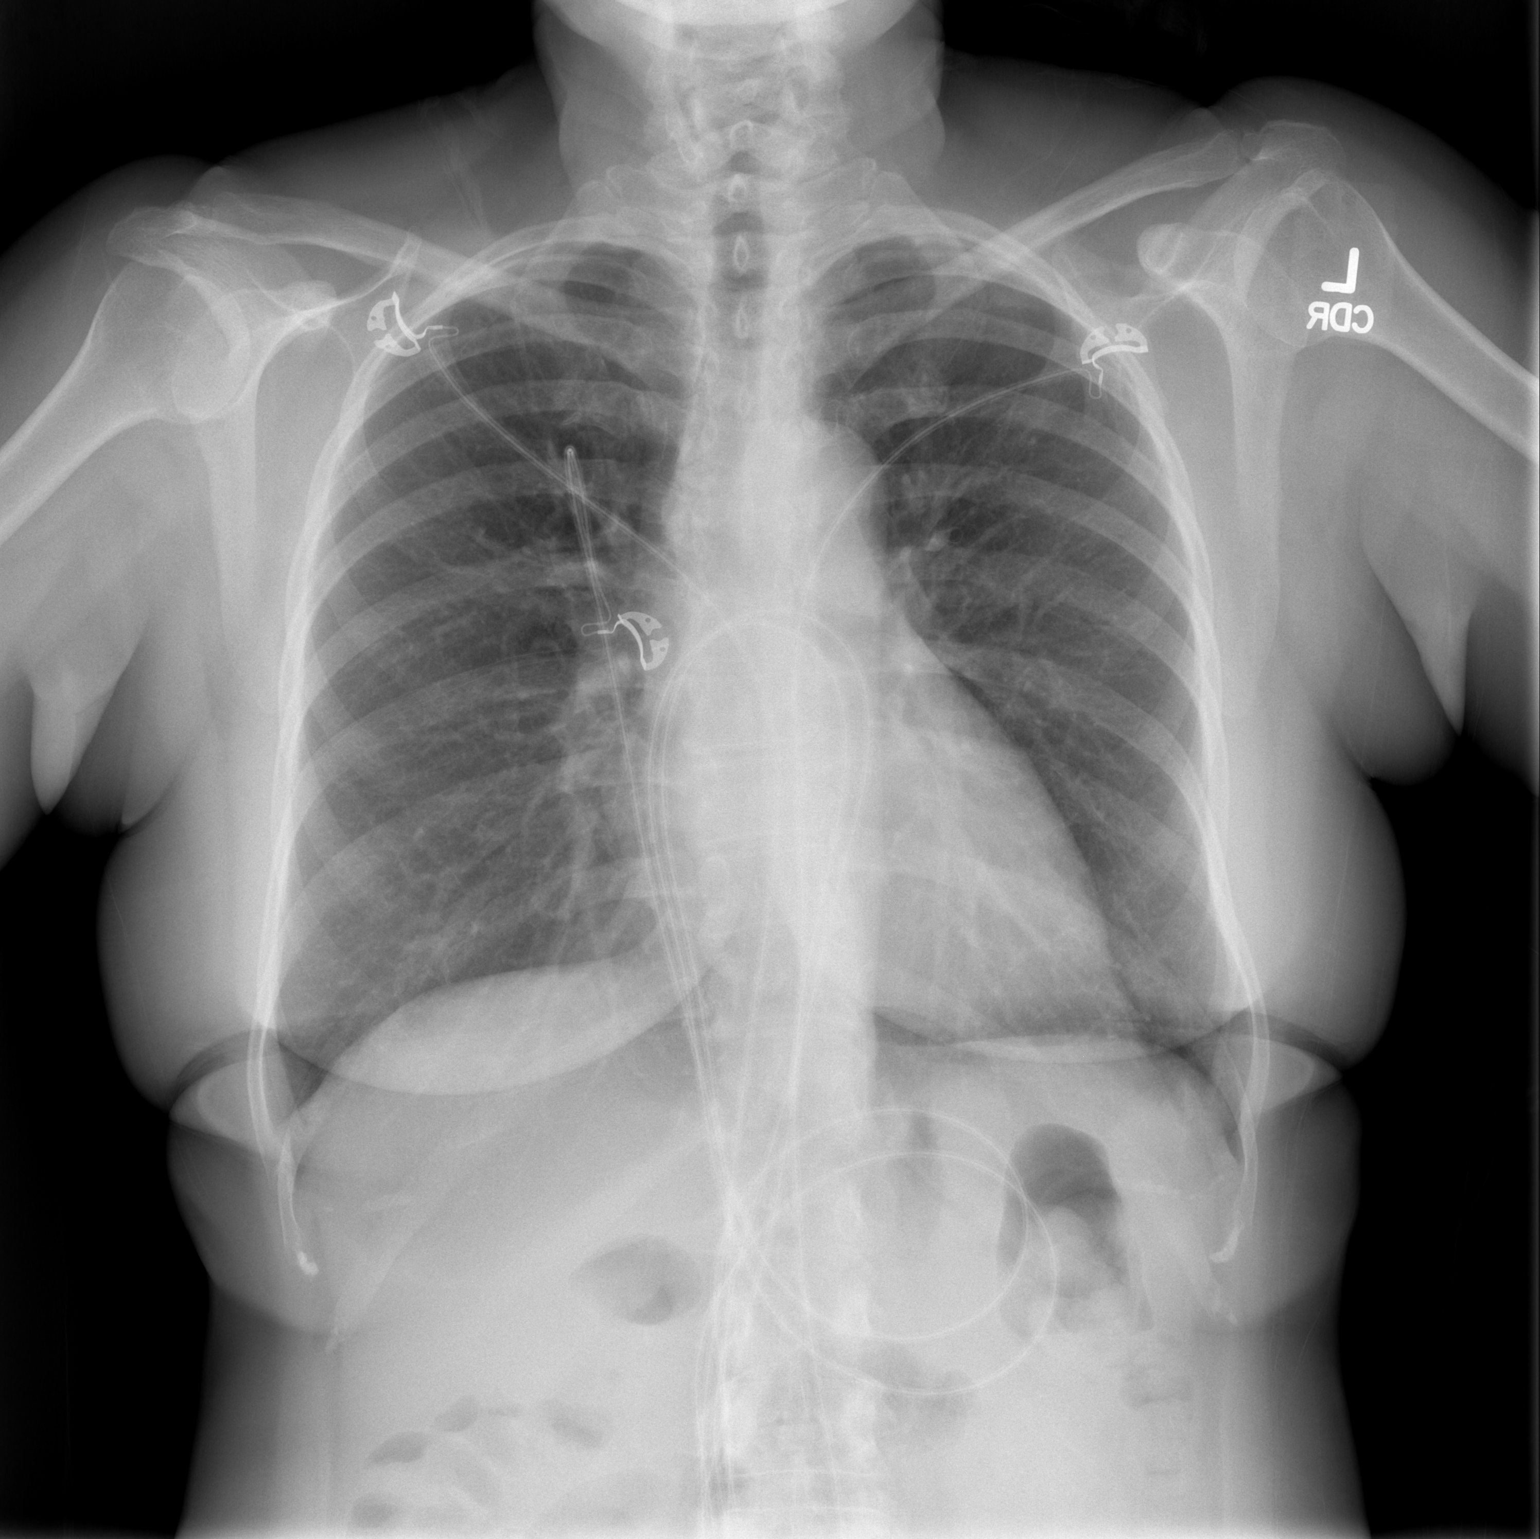

[w chest lat]
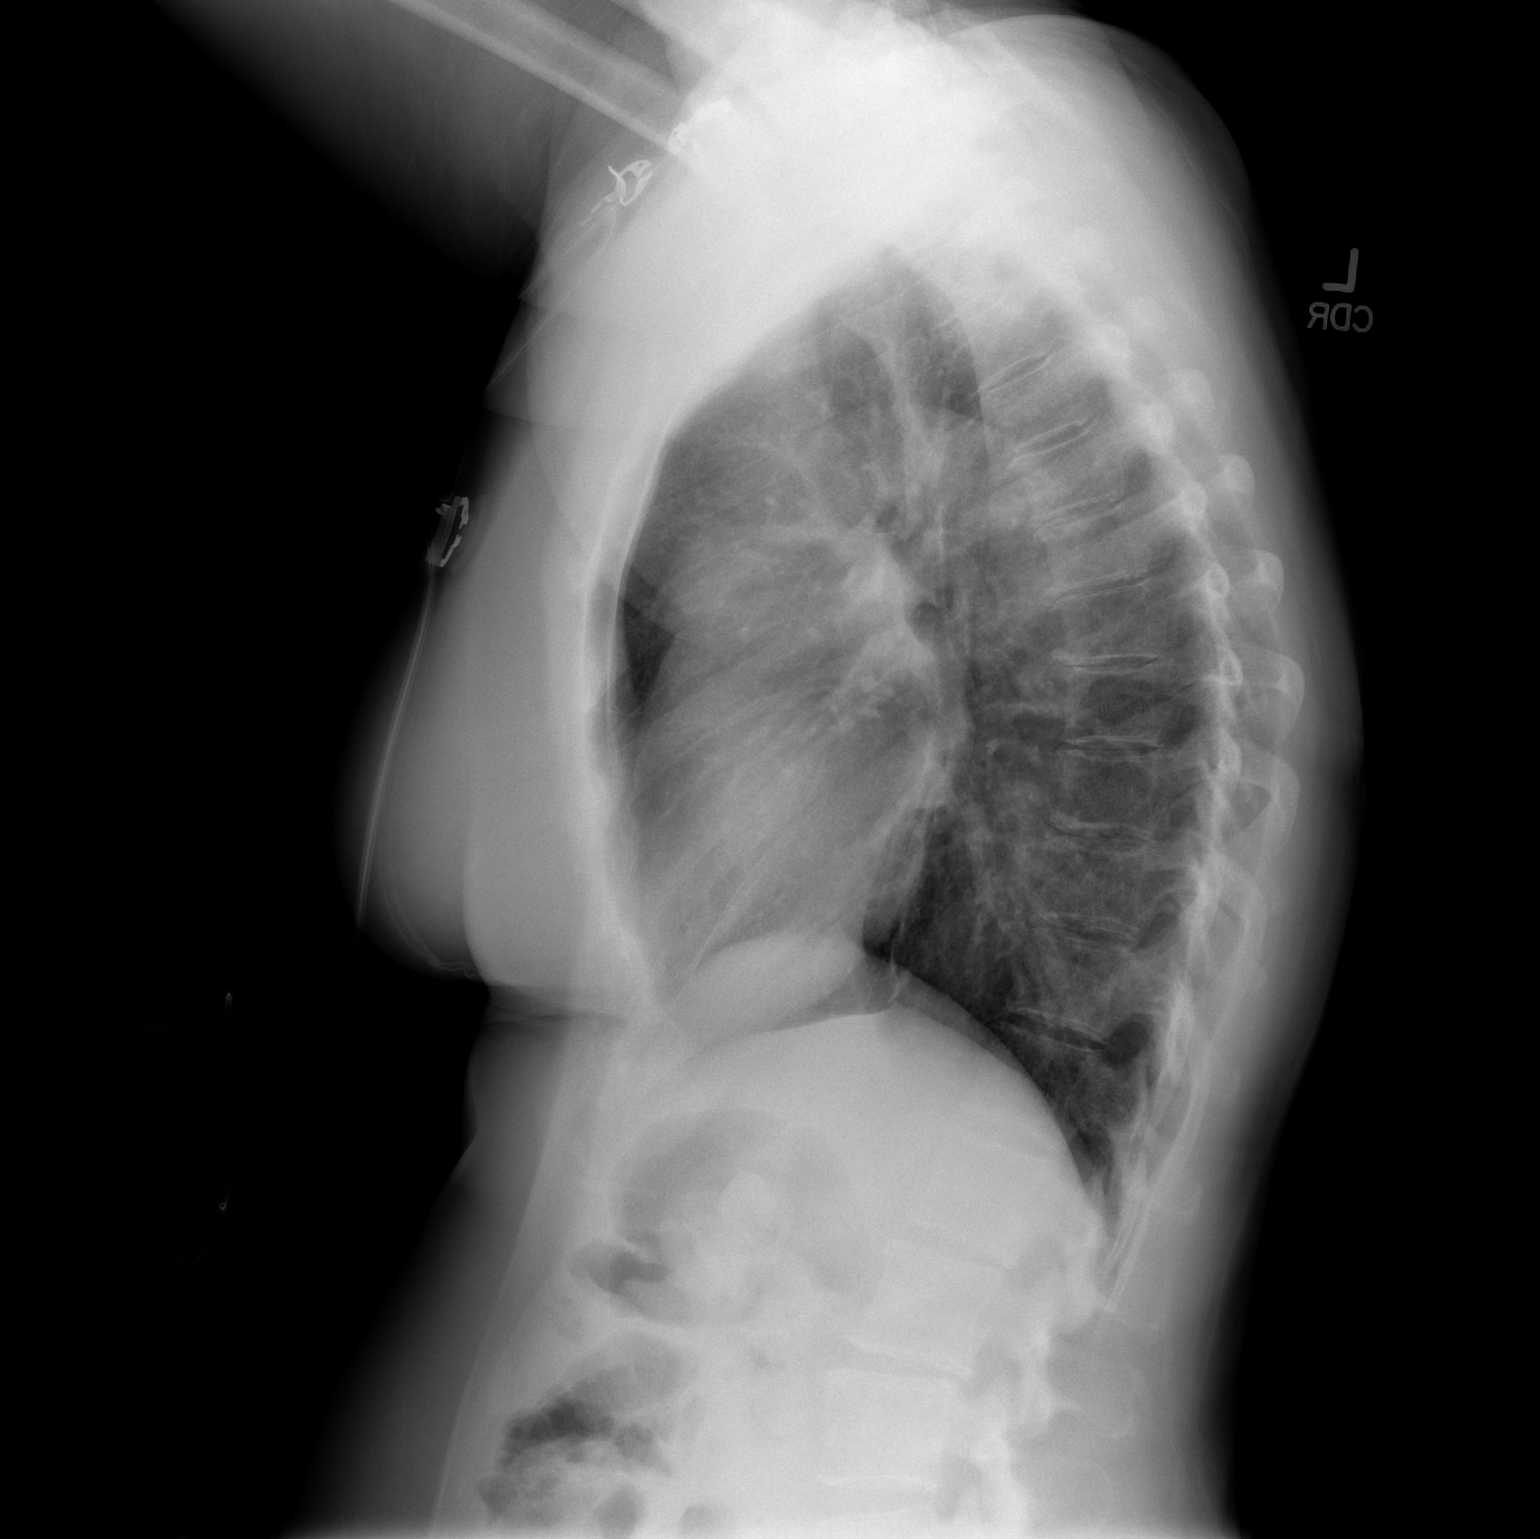

[2 of 2 positions shown; findings below may reference images not displayed]

FINDINGS: The lungs are well-aerated and clear. There is no evidence of focal
opacification, pleural effusion or pneumothorax.

The heart is normal in size; the mediastinal contour is within
normal limits. No acute osseous abnormalities are seen.
IMPRESSION: No acute cardiopulmonary process seen.
